# Patient Record
Sex: Female | Born: 1965
Health system: Southern US, Community
[De-identification: ages and names within clinical notes are randomized; demographics above are authoritative.]

## PROBLEM LIST (undated history)

## (undated) DIAGNOSIS — D649 Anemia, unspecified: Secondary | ICD-10-CM

## (undated) HISTORY — PX: TUBAL LIGATION: SHX77

---

## 2013-03-26 ENCOUNTER — Encounter (HOSPITAL_COMMUNITY): Payer: Self-pay | Admitting: *Deleted

## 2013-03-26 ENCOUNTER — Emergency Department (HOSPITAL_COMMUNITY)
Admission: EM | Admit: 2013-03-26 | Discharge: 2013-03-26 | Disposition: A | Discharge status: Home / Self Care | Payer: Self-pay | Attending: Emergency Medicine | Admitting: Emergency Medicine

## 2013-03-26 DIAGNOSIS — N390 Urinary tract infection, site not specified: Secondary | ICD-10-CM | POA: Insufficient documentation

## 2013-03-26 DIAGNOSIS — Z9851 Tubal ligation status: Secondary | ICD-10-CM | POA: Insufficient documentation

## 2013-03-26 DIAGNOSIS — F17200 Nicotine dependence, unspecified, uncomplicated: Secondary | ICD-10-CM | POA: Insufficient documentation

## 2013-03-26 DIAGNOSIS — R252 Cramp and spasm: Secondary | ICD-10-CM

## 2013-03-26 DIAGNOSIS — Z862 Personal history of diseases of the blood and blood-forming organs and certain disorders involving the immune mechanism: Secondary | ICD-10-CM | POA: Insufficient documentation

## 2013-03-26 HISTORY — DX: Anemia, unspecified: D64.9

## 2013-03-26 LAB — URINALYSIS, ROUTINE W REFLEX MICROSCOPIC
Glucose, UA: NEGATIVE mg/dL
Ketones, ur: NEGATIVE mg/dL
Nitrite: POSITIVE — AB
Protein, ur: 100 mg/dL — AB
Specific Gravity, Urine: 1.015 (ref 1.005–1.030)
Urobilinogen, UA: 1 mg/dL (ref 0.0–1.0)
pH: 5.5 (ref 5.0–8.0)

## 2013-03-26 LAB — CBC WITH DIFFERENTIAL/PLATELET
Basophils Absolute: 0.1 10*3/uL (ref 0.0–0.1)
Basophils Relative: 1 % (ref 0–1)
Eosinophils Absolute: 0.1 10*3/uL (ref 0.0–0.7)
Eosinophils Relative: 2 % (ref 0–5)
HCT: 28.1 % — ABNORMAL LOW (ref 36.0–46.0)
Hemoglobin: 8.8 g/dL — ABNORMAL LOW (ref 12.0–15.0)
Lymphocytes Relative: 33 % (ref 12–46)
Lymphs Abs: 2.4 10*3/uL (ref 0.7–4.0)
MCH: 20.8 pg — ABNORMAL LOW (ref 26.0–34.0)
MCHC: 31.3 g/dL (ref 30.0–36.0)
MCV: 66.4 fL — ABNORMAL LOW (ref 78.0–100.0)
Monocytes Absolute: 0.6 10*3/uL (ref 0.1–1.0)
Monocytes Relative: 8 % (ref 3–12)
Neutro Abs: 4.1 10*3/uL (ref 1.7–7.7)
Neutrophils Relative %: 56 % (ref 43–77)
Platelets: 190 10*3/uL (ref 150–400)
RBC: 4.23 MIL/uL (ref 3.87–5.11)
RDW: 17.3 % — ABNORMAL HIGH (ref 11.5–15.5)
WBC: 7.3 10*3/uL (ref 4.0–10.5)

## 2013-03-26 LAB — URINE MICROSCOPIC-ADD ON

## 2013-03-26 LAB — BASIC METABOLIC PANEL
BUN: 13 mg/dL (ref 6–23)
CO2: 21 mEq/L (ref 19–32)
Calcium: 9.6 mg/dL (ref 8.4–10.5)
Chloride: 99 mEq/L (ref 96–112)
Creatinine, Ser: 0.9 mg/dL (ref 0.50–1.10)
GFR calc Af Amer: 88 mL/min — ABNORMAL LOW (ref >90)
GFR calc non Af Amer: 76 mL/min — ABNORMAL LOW (ref >90)
Glucose, Bld: 94 mg/dL (ref 70–99)
Potassium: 3.6 mEq/L (ref 3.5–5.1)
Sodium: 132 mEq/L — ABNORMAL LOW (ref 135–145)

## 2013-03-26 MED ORDER — KETOROLAC TROMETHAMINE 15 MG/ML IJ SOLN
15.0000 mg | Freq: Once | INTRAMUSCULAR | Status: AC
  Administered 2013-03-26: 15 mg via INTRAVENOUS
  Filled 2013-03-26: qty 1

## 2013-03-26 MED ORDER — SODIUM CHLORIDE 0.9 % IV BOLUS (SEPSIS)
1000.0000 mL | Freq: Once | INTRAVENOUS | Status: AC
  Administered 2013-03-26: 1000 mL via INTRAVENOUS

## 2013-03-26 MED ORDER — SULFAMETHOXAZOLE-TRIMETHOPRIM 800-160 MG PO TABS: 1.0000 | ORAL_TABLET | Freq: Two times a day (BID) | ORAL | Status: AC

## 2013-03-26 MED ORDER — DEXTROSE 5 % IV SOLN
1.0000 g | Freq: Once | INTRAVENOUS | Status: AC
  Administered 2013-03-26: 1 g via INTRAVENOUS
  Filled 2013-03-26: qty 10

## 2013-03-26 NOTE — ED Notes (Signed)
Pt c/o cramps to entire body. Onset 1 hr ago while at work. Denies vomiting or diarrhea. Denies hx of same.

## 2013-03-26 NOTE — ED Provider Notes (Signed)
History    46yf with body cramps. Onset today while at work. Diffuse. NO rash. No joint swelling. No fever or chills. No cp or sob. In usual state of health when got up today. No recent meds or otcs. No signficant pmhx but does not have routine medical care.   CSN: [id number]  Arrival date & time 03/26/13  1401   First MD Initiated Contact with Patient 03/26/13 1625      Chief Complaint  Patient presents with  . Abdominal Cramping    (Consider location/radiation/quality/duration/timing/severity/associated sxs/prior treatment) HPI  Past Medical History  Diagnosis Date  . Anemia     Past Surgical History  Procedure Laterality Date  . Tubal ligation      History reviewed. No pertinent family history.  History  Substance Use Topics  . Smoking status: Current Every Day Smoker    Types: Cigarettes  . Smokeless tobacco: Not on file  . Alcohol Use: Yes    OB History   Grav Para Term Preterm Abortions TAB SAB Ect Mult Living                  Review of Systems  All systems reviewed and negative, other than as noted in HPI.   Allergies  Review of patient's allergies indicates no known allergies.  Home Medications   Current Outpatient Rx  Name  Route  Sig  Dispense  Refill  . Aspirin-Acetaminophen-Caffeine (GOODY HEADACHE PO)   Oral   Take 1 Package by mouth as needed (pain).           LMP 03/21/2013  Physical Exam  Nursing note and vitals reviewed. Constitutional: She appears well-developed and well-nourished. No distress.  HENT:  Head: Normocephalic and atraumatic.  Eyes: Conjunctivae are normal. Right eye exhibits no discharge. Left eye exhibits no discharge.  Neck: Neck supple.  Cardiovascular: Normal rate, regular rhythm and normal heart sounds.  Exam reveals no gallop and no friction rub.   No murmur heard. Pulmonary/Chest: Effort normal and breath sounds normal. No respiratory distress.  Abdominal: Soft. She exhibits no distension. There is no  tenderness.  Musculoskeletal: She exhibits no edema and no tenderness.  Neurological: She is alert.  Skin: Skin is warm and dry. No rash noted.  Psychiatric: She has a normal mood and affect. Her behavior is normal. Thought content normal.    ED Course  Procedures (including critical care time)  Labs Reviewed  CBC WITH DIFFERENTIAL - Abnormal; Notable for the following:    Hemoglobin 8.8 (*)    HCT 28.1 (*)    MCV 66.4 (*)    MCH 20.8 (*)    RDW 17.3 (*)    All other components within normal limits  BASIC METABOLIC PANEL - Abnormal; Notable for the following:    Sodium 132 (*)    GFR calc non Af Amer 76 (*)    GFR calc Af Amer 88 (*)    All other components within normal limits  URINALYSIS, ROUTINE W REFLEX MICROSCOPIC - Abnormal; Notable for the following:    Color, Urine RED (*)    APPearance TURBID (*)    Hgb urine dipstick LARGE (*)    Bilirubin Urine LARGE (*)    Protein, ur 100 (*)    Nitrite POSITIVE (*)    Leukocytes, UA SMALL (*)    All other components within normal limits  URINE MICROSCOPIC-ADD ON - Abnormal; Notable for the following:    Bacteria, UA MANY (*)    All other  components within normal limits   No results found.   1. UTI (urinary tract infection)   2. Muscle cramps       MDM  46yf with body cramps. Abdominal cramps possibly explained by UTI. HD stable. No rash. No muscle tenderness. Lytes ok. Plan tx for uti. Return precautions dicussed.         Raeford Razor, MD 03/29/13 [phone]

## 2013-03-26 NOTE — ED Notes (Signed)
Patient states that she has been having cramping that started in her her bilateral hands and arms and has progressed to her abdomen and back today. Patient denies any vomiting or diarrhea.

## 2014-10-30 ENCOUNTER — Ambulatory Visit (INDEPENDENT_AMBULATORY_CARE_PROVIDER_SITE_OTHER): Payer: 59 | Admitting: Family Medicine

## 2014-10-30 ENCOUNTER — Ambulatory Visit (INDEPENDENT_AMBULATORY_CARE_PROVIDER_SITE_OTHER): Payer: 59

## 2014-10-30 VITALS — BP 102/62 | HR 63 | Temp 98.1°F | Resp 16 | Ht 66.0 in | Wt 115.0 lb

## 2014-10-30 DIAGNOSIS — M5442 Lumbago with sciatica, left side: Secondary | ICD-10-CM

## 2014-10-30 MED ORDER — PREDNISONE 20 MG PO TABS: ORAL_TABLET | ORAL | Status: DC

## 2014-10-30 MED ORDER — HYDROCODONE-ACETAMINOPHEN 5-325 MG PO TABS: ORAL_TABLET | ORAL | Status: AC

## 2014-10-30 NOTE — Addendum Note (Signed)
Addended by: HOPPER, DAVID H on: 10/30/2014 02:16 PM   Modules accepted: Orders

## 2014-10-30 NOTE — Patient Instructions (Addendum)
Take the prednisone 3 tablets daily for 2 days, then 2 daily for 2 days, then 1 daily for 2 days, then one half daily for 4 days.  If not significantly improved over the next 10 days please return  Take ibuprofen or Aleve if needed for pain (maximum ibuprofen 800 (4200) mg 3 times daily or Aleve 440 (2220) mg 3 times daily)   Hydrocodone/APAP 5/325 only if needed for severe pain   Return if further problems at any time.
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
## 2014-11-03 ENCOUNTER — Telehealth: Payer: Self-pay

## 2014-11-03 NOTE — Telephone Encounter (Signed)
PATIENT STATES SHE CAME IN AND SAW DR. HOPPER ON Friday FOR LOW BACK PAIN. HE PRESCRIBED HER NORCO 5/325. SHE WOULD LIKE TO GET IT REFILLED. SHE WOULD LIKE TO BE CALLED WHEN THE PRESCRIPTION HAS BEEN WRITTEN SO THAT SHE CAN PICK IT UP. BEST PHONE [phone] (CELL)  MBC

## 2014-11-06 NOTE — Telephone Encounter (Signed)
If still having enough pain to need hydrocodone needs to be rechecked to decide if further studies or referral needed.  No refill on pain meds until seen.

## 2014-11-06 NOTE — Telephone Encounter (Signed)
Spoke to pt, she is aware of need for ov. She is still having back pain and will RTC as soon as possible.

## 2014-11-10 ENCOUNTER — Ambulatory Visit (INDEPENDENT_AMBULATORY_CARE_PROVIDER_SITE_OTHER): Payer: 59 | Admitting: Family Medicine

## 2014-11-10 VITALS — BP 108/68 | HR 71 | Temp 98.3°F | Resp 18 | Ht 68.0 in | Wt 113.0 lb

## 2014-11-10 DIAGNOSIS — M5416 Radiculopathy, lumbar region: Secondary | ICD-10-CM

## 2014-11-10 DIAGNOSIS — M5442 Lumbago with sciatica, left side: Secondary | ICD-10-CM

## 2014-11-10 MED ORDER — TRAMADOL HCL 50 MG PO TABS: 50.0000 mg | ORAL_TABLET | Freq: Three times a day (TID) | ORAL | Status: AC | PRN

## 2014-11-10 MED ORDER — PREDNISONE 20 MG PO TABS: ORAL_TABLET | ORAL | Status: AC

## 2014-11-10 NOTE — Progress Notes (Signed)
Subjective: 48 year old lady who was here 11 days ago with similar symptoms. She continues to have a lot of trouble with a burning pain going down her legs. Sometimes as far as the calves. She hurts down into the medial thigh area. She says it feels like an electric shock shooting down. It is okay when she is laying in bed, but when she gets up in the morning it is very bad. She has not been able to take the hydrocodone when she is going to work. She works doing housekeeping at [employer]. Knows of no specific injury.  Objective: No real tenderness over the low back. Sensation is normal and the thighs. She denies any bladder or bowel problems. Straight leg raising test 80 on the right but only about 65 to 70 on the left. With that she described pain going down to her calf. Deep tender reflexes are 1-2+ symmetrical. Sensory grossly intact.  Assessment: Lumbar radiculopathy  Plan: Conservative treatment with steroids and pain medications have not helped. We will leave her off work for one week. We will try tramadol for milder pain. And going to give one more round of steroids.

## 2014-11-10 NOTE — Patient Instructions (Signed)
We will leave you off work for one week and have you return for a recheck at that time.  MRI is being scheduled. This usually has to be approved through the insurance carrier and sometimes takes a little while to get accomplished. Y  Try tramadol for milder pain relief  Return if ever acutely worse or bladder or bowel symptoms

## 2014-11-16 ENCOUNTER — Telehealth: Payer: Self-pay

## 2014-11-16 NOTE — Telephone Encounter (Signed)
Pt is needing to talk with someone about an mri

## 2014-11-17 NOTE — Telephone Encounter (Signed)
Spoke to pt about MRI- gave number to [facility] to call to schedule.

## 2014-11-19 ENCOUNTER — Other Ambulatory Visit: Payer: Self-pay | Admitting: Family Medicine

## 2014-11-19 ENCOUNTER — Telehealth: Payer: Self-pay

## 2014-11-19 NOTE — Telephone Encounter (Signed)
Please advise if ok to extend OOW note

## 2014-11-19 NOTE — Telephone Encounter (Signed)
Call patient: I am not going to refill any more prednisone. I'm not sure which medicine refill we are talking about. If the pain is too bad she needs to be rechecked between now and when the MRI is done. I do not know what date that is gotten scheduled for yet.

## 2014-11-19 NOTE — Telephone Encounter (Signed)
Pt is needing to talk with someone about extending her work note

## 2014-11-19 NOTE — Telephone Encounter (Signed)
Pt called to check on status of her medication refill. Please advise at [phone] (H)

## 2014-11-20 NOTE — Telephone Encounter (Signed)
Spoke to patient, advised unable to refill the prednisone as she has already had a couple rounds, and side effects for long term prednisone can be very serious. Work note provided through the end of the week, hopefully the MRI scan will be done before then. Note to referrals Please check on the status of the MRI patient c/o severe Low back pain

## 2014-11-20 NOTE — Telephone Encounter (Signed)
Left message for her to call back so I can advise.  

## 2014-11-24 NOTE — Telephone Encounter (Signed)
According to notes from [facility] patient is waiting to schedule mri referral

## 2014-11-25 ENCOUNTER — Encounter: Payer: Self-pay | Admitting: Family Medicine

## 2014-11-25 ENCOUNTER — Telehealth: Payer: Self-pay | Admitting: Family Medicine

## 2014-11-25 NOTE — Telephone Encounter (Signed)
[employer] faxed FMLA forms for this patient on 11/23/2014. Placed in Hopper's box on 11/25/2013. [facility] complete.

## 2014-11-26 ENCOUNTER — Ambulatory Visit
Admission: RE | Admit: 2014-11-26 | Discharge: 2014-11-26 | Disposition: A | Discharge status: Home / Self Care | Payer: 59 | Source: Ambulatory Visit | Attending: Family Medicine | Admitting: Family Medicine

## 2014-11-26 DIAGNOSIS — M5416 Radiculopathy, lumbar region: Secondary | ICD-10-CM

## 2014-12-28 ENCOUNTER — Telehealth: Payer: Self-pay

## 2014-12-28 NOTE — Telephone Encounter (Signed)
Pt dropped off FMLA for Dr. Alwyn RenHopper to complete in 5-7 business days

## 2014-12-31 NOTE — Telephone Encounter (Signed)
Received FMLA ppw back from Dr. Alwyn RenHopper requesting that pt return to clinic for a follow up before completing ppw. Spoke with Pt, and she understood. Will make sure there is a copy in Pt's file for when she decides to RTC

## 2015-01-11 ENCOUNTER — Ambulatory Visit (INDEPENDENT_AMBULATORY_CARE_PROVIDER_SITE_OTHER): Payer: 59 | Admitting: Family Medicine

## 2015-01-11 ENCOUNTER — Telehealth: Payer: Self-pay | Admitting: Family Medicine

## 2015-01-11 VITALS — BP 116/62 | HR 98 | Temp 97.8°F | Resp 18 | Ht 68.5 in | Wt 115.0 lb

## 2015-01-11 DIAGNOSIS — M545 Low back pain, unspecified: Secondary | ICD-10-CM

## 2015-01-11 MED ORDER — DICLOFENAC SODIUM 75 MG PO TBEC: 75.0000 mg | DELAYED_RELEASE_TABLET | Freq: Two times a day (BID) | ORAL | Status: AC

## 2015-01-11 NOTE — Telephone Encounter (Signed)
Patient had OV on 01/11/2015. Patient inquired about her FMLA paperwork during her visit. FMLA paperwork not located, patient notified of that. There is a scanned copy of paperwork in patient's chart however page 2 of the 3 pages is missing. Called [employer] to have another copy faxed to our office. Was on hold for 15 mins then was disconnected. Will call back tomorrow.

## 2015-01-11 NOTE — Patient Instructions (Signed)
Take diclofenac 75 mg 1 pill twice daily with food at breakfast and supper. Discontinue use if it causes abdominal pain. Her graph return in one month for recheck.   Before you come in call the office and find out what my shift is so that you can come in while I'm here. Ask for me when you sign in..Marland Kitchen

## 2015-01-11 NOTE — Progress Notes (Signed)
Subjective: 49 year old lady who returns because I stated that I could not rule out the FMLA form until I had her in front of me. However, the form appears to been displaced right now, and they're looking for it. She missed the time out of work back in December when I saw her. Subsequent to that she has no missed work, but is continued to have a lot of pain in her low back. She found out the given her a a lot of points for the absence from work in that period of time when I was seeing her. She hurts all the time. Sometimes she feels like she just has to have a hard time even lifting up her fetus she walks. She's not had any falls or other injuries. She does not do stairs, so is not really cognizant of how she would do on them. No abnormal sensations in her legs. Reviewed the results of the MRI which was normal. She does continue to have a lot of pain in the low back. It is directly midline.   Objective: No major distress. Very tender in the lower lumbar spine at about the L5 region. No radiculopathy. Straight leg raising negative. Gait seems a little clumsy right when she gets up, she walks with a little difficulty but then loosened on up and walks better. She has mild intoeing. Deep tendon reflexes 2+ at knees, 1+ ankle. Negative Babinski. Sensory grossly intact. Pedal pulses good.  Assessment: Low back pain in lumbar region, etiology undetermined  Plan: If she has more of the problems with her legs feeling rubbery she is to let me know. We might have to refer her to a neurologist. However she had a normal MRI and just seems to be tender in the low back. Will complete the FMLA forms when they can be located. I may have to call her and asked questions. Put on diclofenac for one month and have her return to time.

## 2015-01-12 DIAGNOSIS — Z0271 Encounter for disability determination: Secondary | ICD-10-CM

## 2015-11-18 ENCOUNTER — Encounter (HOSPITAL_COMMUNITY): Payer: Self-pay | Admitting: Emergency Medicine

## 2015-11-18 ENCOUNTER — Emergency Department (HOSPITAL_COMMUNITY): Payer: 59

## 2015-11-18 ENCOUNTER — Emergency Department (HOSPITAL_COMMUNITY)
Admission: EM | Admit: 2015-11-18 | Discharge: 2015-11-18 | Disposition: A | Discharge status: Home / Self Care | Payer: 59 | Attending: Emergency Medicine | Admitting: Emergency Medicine

## 2015-11-18 DIAGNOSIS — R200 Anesthesia of skin: Secondary | ICD-10-CM | POA: Diagnosis not present

## 2015-11-18 DIAGNOSIS — Z862 Personal history of diseases of the blood and blood-forming organs and certain disorders involving the immune mechanism: Secondary | ICD-10-CM | POA: Insufficient documentation

## 2015-11-18 DIAGNOSIS — Y9389 Activity, other specified: Secondary | ICD-10-CM | POA: Insufficient documentation

## 2015-11-18 DIAGNOSIS — W2209XA Striking against other stationary object, initial encounter: Secondary | ICD-10-CM | POA: Diagnosis not present

## 2015-11-18 DIAGNOSIS — Y9289 Other specified places as the place of occurrence of the external cause: Secondary | ICD-10-CM | POA: Diagnosis not present

## 2015-11-18 DIAGNOSIS — S5001XA Contusion of right elbow, initial encounter: Secondary | ICD-10-CM | POA: Diagnosis not present

## 2015-11-18 DIAGNOSIS — F17210 Nicotine dependence, cigarettes, uncomplicated: Secondary | ICD-10-CM | POA: Diagnosis not present

## 2015-11-18 DIAGNOSIS — S59901A Unspecified injury of right elbow, initial encounter: Secondary | ICD-10-CM | POA: Diagnosis present

## 2015-11-18 DIAGNOSIS — Y999 Unspecified external cause status: Secondary | ICD-10-CM | POA: Insufficient documentation

## 2015-11-18 DIAGNOSIS — Z791 Long term (current) use of non-steroidal anti-inflammatories (NSAID): Secondary | ICD-10-CM | POA: Insufficient documentation

## 2015-11-18 MED ORDER — TRAMADOL HCL 50 MG PO TABS
50.0000 mg | ORAL_TABLET | Freq: Four times a day (QID) | ORAL | Status: AC | PRN
Start: 2015-11-18 — End: ?

## 2015-11-18 MED ORDER — ACETAMINOPHEN 325 MG PO TABS
650.0000 mg | ORAL_TABLET | Freq: Once | ORAL | Status: AC
  Administered 2015-11-18: 650 mg via ORAL
  Filled 2015-11-18: qty 2

## 2015-11-18 NOTE — ED Notes (Signed)
Called for patient 3 times, No answer , The Nurse was informed.

## 2015-11-18 NOTE — Discharge Instructions (Signed)
Elbow Contusion °An elbow contusion is a deep bruise of the elbow. Contusions are the result of an injury that caused bleeding under the skin. The contusion may turn blue, purple, or yellow. Minor injuries will give you a painless contusion, but more severe contusions may stay painful and swollen for a few weeks.  °CAUSES  °An elbow contusion comes from a direct force to that area, such as falling on the elbow. °SYMPTOMS  °· Swelling and redness of the elbow. °· Bruising of the elbow area. °· Tenderness or soreness of the elbow. °DIAGNOSIS  °You will have a physical exam and will be asked about your history. You may need an X-ray of your elbow to look for a broken bone (fracture).  °TREATMENT  °A sling or splint may be needed to support your injury. Resting, elevating, and applying cold compresses to the elbow area are often the best treatments for an elbow contusion. Over-the-counter medicines may also be recommended for pain control. °HOME CARE INSTRUCTIONS  °· Put ice on the injured area. °¨ Put ice in a plastic bag. °¨ Place a towel between your skin and the bag. °¨ Leave the ice on for 15-20 minutes, 03-04 times a day. °· Only take over-the-counter or prescription medicines for pain, discomfort, or fever as directed by your caregiver. °· Rest your injured elbow until the pain and swelling are better. °· Elevate your elbow to reduce swelling. °· Apply a compression wrap as directed by your caregiver. This can help reduce swelling and motion. You may remove the wrap for sleeping, showers, and baths. If your fingers become numb, cold, or blue, take the wrap off and reapply it more loosely. °· Use your elbow only as directed by your caregiver. You may be asked to do range of motion exercises. Do them as directed. °· See your caregiver as directed. It is very important to keep all follow-up appointments in order to avoid any long-term problems with your elbow, including chronic pain or inability to move your elbow  normally. °SEEK IMMEDIATE MEDICAL CARE IF:  °· You have increased redness, swelling, or pain in your elbow. °· Your swelling or pain is not relieved with medicines. °· You have swelling of the hand and fingers. °· You are unable to move your fingers or wrist. °· You begin to lose feeling in your hand or fingers. °· Your fingers or hand become cold or blue. °MAKE SURE YOU:  °· Understand these instructions. °· Will watch your condition. °· Will get help right away if you are not doing well or get worse. °  °This information is not intended to replace advice given to you by your health care provider. Make sure you discuss any questions you have with your health care provider. °  °Document Released: 10/15/2006 Document Revised: 01/29/2012 Document Reviewed: 06/21/2015 °Elsevier Interactive Patient Education ©2016 Elsevier Inc. ° °

## 2015-11-18 NOTE — ED Notes (Signed)
Patient states hit R elbow on door/wall x 3 weeks ago.  Patient states increased pain since.  Denies other problems.

## 2015-11-18 NOTE — ED Notes (Signed)
Waiting for ortho to place elbow brace.

## 2015-11-18 NOTE — ED Provider Notes (Signed)
CSN: [id number]     Arrival date & time 11/18/15  1033 History  By signing my name below, I, Shelby Adams, attest that this documentation has been prepared under the direction and in the presence of Marlon Peliffany Azion Centrella, PA-C Electronically Signed: Charline BillsEssence Adams, ED Scribe 11/18/2015 at 12:21 PM.   Chief Complaint  Patient presents with  . Elbow Injury   The history is provided by the patient. No language interpreter was used.   HPI Comments: Shelby Adams is a 49 y.o. female who presents to the Emergency Department complaining of gradually worsening, constant right elbow pain onset 3 weeks ago. Pt states that she struck her right elbow on a car door twice and once on a wall at work while pulling on an object. Pt reports a 8/10, burning sensation in her right elbow that is exacerbated with palpation and movement. She also reports associated numbness in her right fingers. She has tried an ace wrap last night without significant relief. No medications tried PTA.  Past Medical History  Diagnosis Date  . Anemia    Past Surgical History  Procedure Laterality Date  . Tubal ligation     Family History  Problem Relation Age of Onset  . COPD Mother    Social History  Substance Use Topics  . Smoking status: Current Every Day Smoker -- 0.50 packs/day for 0 years    Types: Cigarettes  . Smokeless tobacco: None  . Alcohol Use: Yes   OB History    No data available     Review of Systems  Musculoskeletal: Positive for arthralgias.  Neurological: Positive for numbness.  All other systems reviewed and are negative.  Allergies  Review of patient's allergies indicates no known allergies.  Home Medications   Prior to Admission medications   Medication Sig Start Date End Date Taking? Authorizing Provider  Aspirin-Acetaminophen-Caffeine (GOODY HEADACHE PO) Take 1 Package by mouth as needed (pain).    Historical Provider, MD  diclofenac (VOLTAREN) 75 MG EC tablet Take 1 tablet (75 mg total)  by mouth 2 (two) times daily. 01/11/15   Peyton Najjaravid H Hopper, MD  HYDROcodone-acetaminophen [facility](NORCO) 5-325 MG per tablet Take one every 6 hours only if needed for severe pain Patient not taking: Reported on 01/11/2015 10/30/14   Peyton Najjaravid H Hopper, MD  predniSONE (DELTASONE) 20 MG tablet Take 3 daily for 2 days, then 2 daily for 2 days, then 1 daily for 2 days, then one half daily. Take after breakfast. Patient not taking: Reported on 01/11/2015 11/10/14   Peyton Najjaravid H Hopper, MD  sulfamethoxazole-trimethoprim (SEPTRA DS) 800-160 MG per tablet Take 1 tablet by mouth every 12 (twelve) hours. Patient not taking: Reported on 10/30/2014 03/26/13   Raeford RazorStephen Kohut, MD  traMADol (ULTRAM) 50 MG tablet Take 1 tablet (50 mg total) by mouth every 8 (eight) hours as needed. Patient not taking: Reported on 01/11/2015 11/10/14   Peyton Najjaravid H Hopper, MD  traMADol (ULTRAM) 50 MG tablet Take 1 tablet (50 mg total) by mouth every 6 (six) hours as needed. 11/18/15   Zebedee Segundo Neva SeatGreene, PA-C   BP 120/74 mmHg  Pulse 68  Temp(Src) 97.6 F (36.4 C) (Oral)  Resp 17  Ht 5\' 6"  (1.676 m)  Wt 110 lb (49.896 kg)  BMI 17.76 kg/m2  SpO2 100%  LMP 03/21/2013 Physical Exam  Constitutional: She is oriented to person, place, and time. She appears well-developed and well-nourished. No distress.  HENT:  Head: Normocephalic and atraumatic.  Eyes: Conjunctivae and EOM are normal.  Neck: Neck supple. No tracheal deviation present.  Cardiovascular: Normal rate.   Pulmonary/Chest: Effort normal. No respiratory distress.  Musculoskeletal:       Right elbow: She exhibits decreased range of motion (due to pain). She exhibits no swelling, no effusion, no deformity and no laceration. Tenderness found. Radial head and olecranon process tenderness noted. No medial epicondyle and no lateral epicondyle tenderness noted.  Pain to elbow with ROM of wrist.  Neurological: She is alert and oriented to person, place, and time.  Skin: Skin is warm and dry.  Psychiatric:  She has a normal mood and affect. Her behavior is normal.  Nursing note and vitals reviewed.  ED Course  Procedures (including critical care time) DIAGNOSTIC STUDIES: Oxygen Saturation is 100% on RA, normal by my interpretation.    COORDINATION OF CARE: 11:15 AM-Discussed treatment plan which includes Tylenol and XR with pt at bedside and pt agreed to plan.   Labs Review Labs Reviewed - No data to display  Imaging Review Dg Elbow Complete Right  11/18/2015  CLINICAL DATA:  Right elbow pain after hitting door 3 weeks ago. EXAM: RIGHT ELBOW - COMPLETE 3+ VIEW COMPARISON:  None. FINDINGS: There is no evidence of fracture, dislocation, or joint effusion. There is no evidence of arthropathy or other focal bone abnormality. Soft tissues are unremarkable. IMPRESSION: Normal right elbow. Electronically Signed   By: Lupita Raider, M.D.   On: 11/18/2015 12:14   I have personally reviewed and evaluated these images and lab results as part of my medical decision-making.   EKG Interpretation None      MDM   Final diagnoses:  Elbow contusion, right, initial encounter   Patient X-Ray negative for obvious fracture or dislocation. Pain managed in ED. Pt advised to follow up with orthopedics if symptoms persist for possibility of missed fracture diagnosis. Patient given an elbow brace while in ED, conservative therapy recommended and discussed. NSAIDS, rest and pain medication. She will be referred to the orthopedic provider since there is no hand specialist on call for today. Patient will be dc home & is agreeable with above plan.  I personally performed the services described in this documentation, which was scribed in my presence. The recorded information has been reviewed and is accurate.    Marlon Pel, PA-C 11/18/15 1228  Donnetta Hutching, MD 11/18/15 [phone]

## 2015-11-18 NOTE — ED Notes (Signed)
Ace wrap placed.   Patient at door waiting to go.

## 2015-11-26 DIAGNOSIS — S5401XA Injury of ulnar nerve at forearm level, right arm, initial encounter: Secondary | ICD-10-CM | POA: Diagnosis not present

## 2015-11-26 DIAGNOSIS — S5001XA Contusion of right elbow, initial encounter: Secondary | ICD-10-CM | POA: Diagnosis not present

## 2015-12-17 DIAGNOSIS — S5001XD Contusion of right elbow, subsequent encounter: Secondary | ICD-10-CM | POA: Diagnosis not present

## 2015-12-17 DIAGNOSIS — S5401XD Injury of ulnar nerve at forearm level, right arm, subsequent encounter: Secondary | ICD-10-CM | POA: Diagnosis not present

## 2015-12-17 DIAGNOSIS — M542 Cervicalgia: Secondary | ICD-10-CM | POA: Diagnosis not present

## 2017-06-27 IMAGING — DX DG ELBOW COMPLETE 3+V*R*
3 series · 3 of 3 positions shown · non-contrast
Comparison: None.

CLINICAL DATA: Right elbow pain after hitting door 3 weeks ago.

EXAM:
RIGHT ELBOW - COMPLETE 3+ VIEW

[x elbow obl right (1 of 2)]
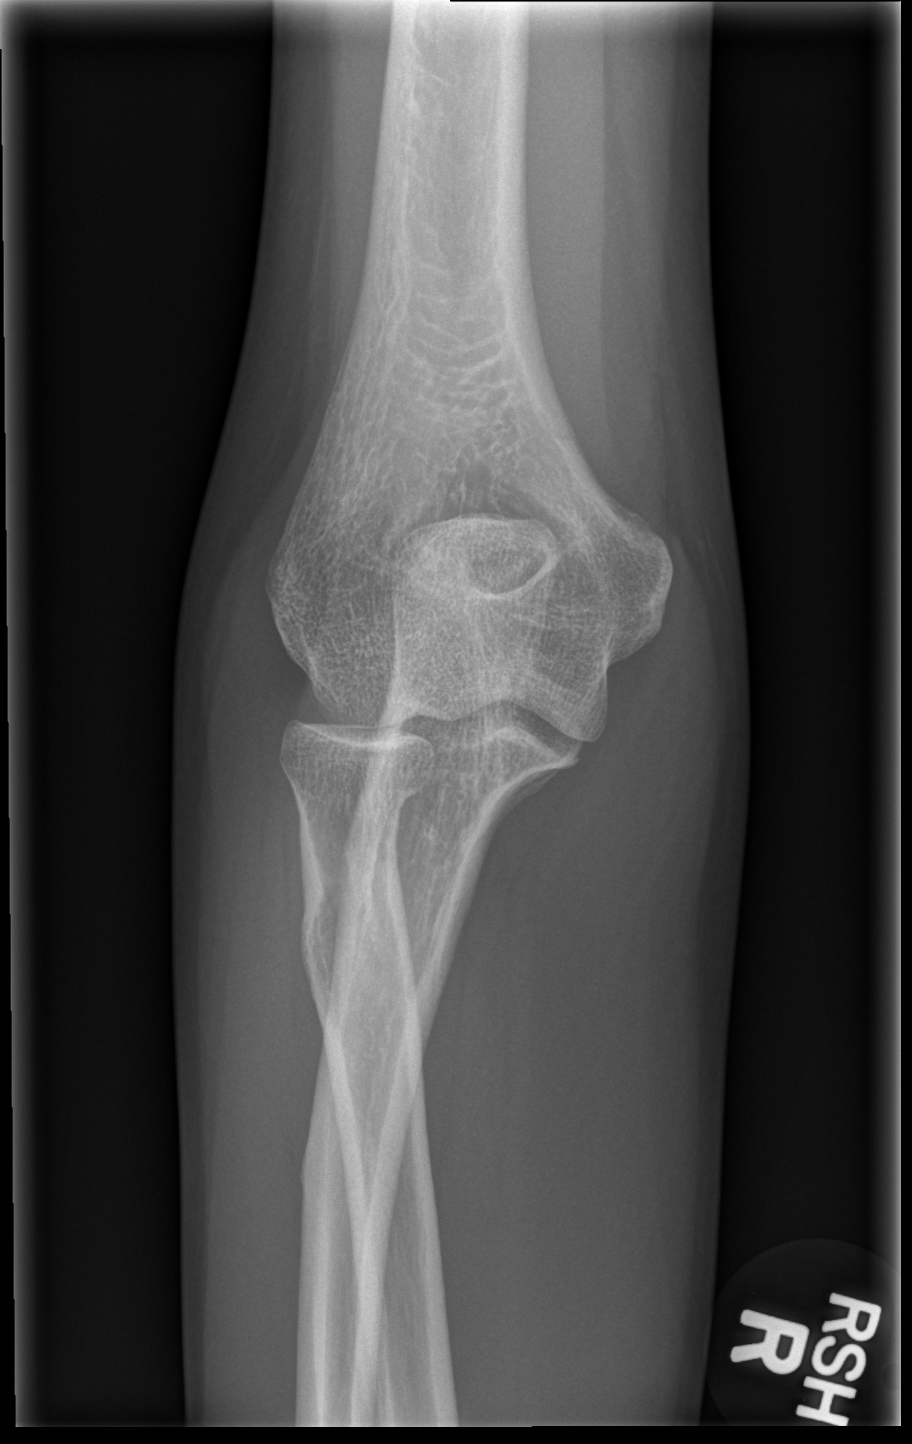

[x elbow obl right (2 of 2)]
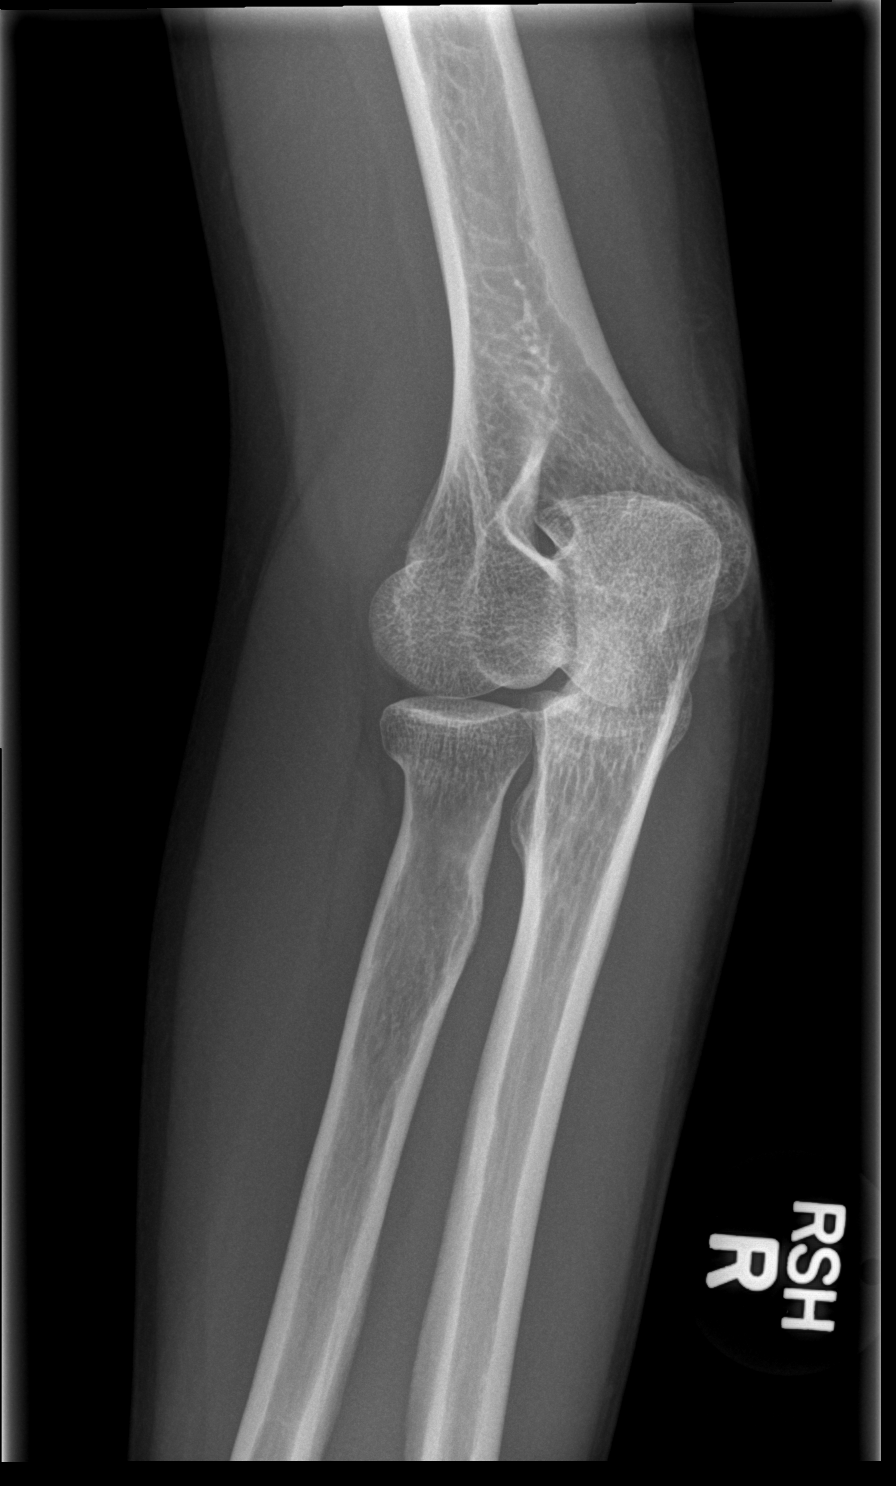

[x elbow lat right]
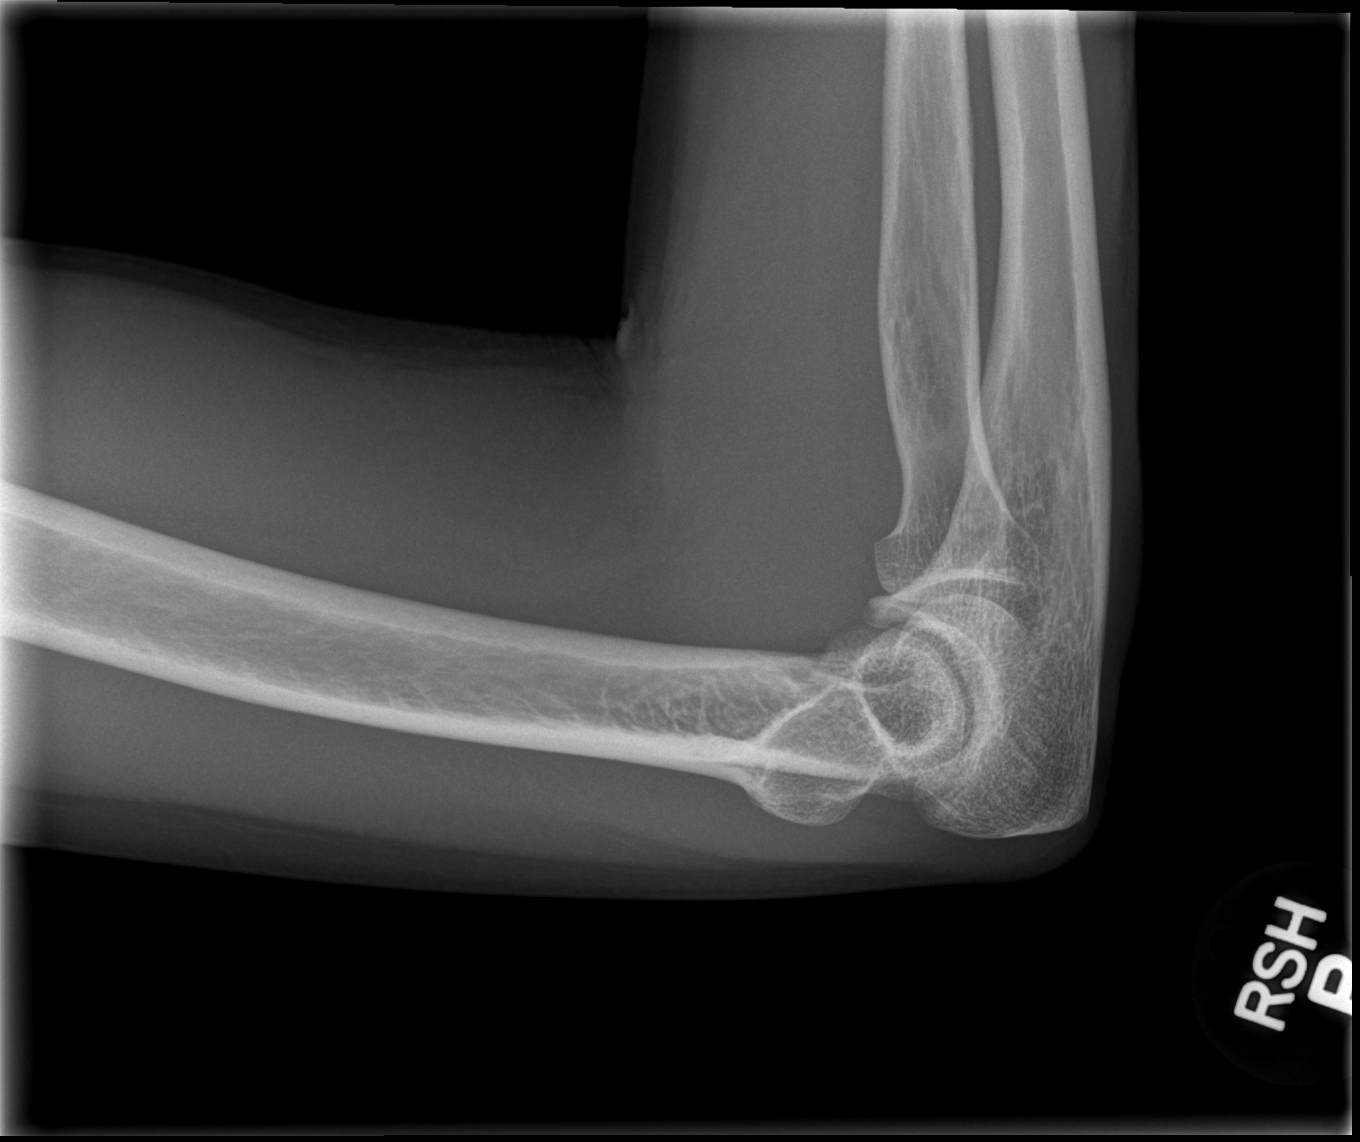

[3 of 3 positions shown; findings below may reference images not displayed]

FINDINGS: There is no evidence of fracture, dislocation, or joint effusion.
There is no evidence of arthropathy or other focal bone abnormality.
Soft tissues are unremarkable.
IMPRESSION: Normal right elbow.

## 2020-09-10 ENCOUNTER — Ambulatory Visit: Payer: 59 | Attending: Internal Medicine

## 2020-09-10 ENCOUNTER — Other Ambulatory Visit (HOSPITAL_BASED_OUTPATIENT_CLINIC_OR_DEPARTMENT_OTHER): Payer: Self-pay | Admitting: Internal Medicine

## 2020-09-10 DIAGNOSIS — Z23 Encounter for immunization: Secondary | ICD-10-CM

## 2020-09-10 NOTE — Progress Notes (Signed)
   Covid-19 Vaccination Clinic  Name:  Shelby Adams    MRN: [id number] DOB: 02/02/1966  09/10/2020  [name] was observed post Covid-19 immunization for 15 minutes without incident. She was provided with Vaccine Information Sheet and instruction to access the V-Safe system.  Vaccinated by [facility]  [name] was instructed to call 911 with any severe reactions post vaccine: Marland Kitchen Difficulty breathing  . Swelling of face and throat  . A fast heartbeat  . A bad rash all over body  . Dizziness and weakness   Immunizations Administered    Name Date Dose VIS Date Route   Moderna COVID-19 Vaccine 09/10/2020  9:16 AM 0.5 mL 10/2019 Intramuscular   Manufacturer: Moderna   Lot: [id number]   NDC: 62863-817-71

## 2020-09-15 MED FILL — MODERNA COVID-19 VACCINE 10: 100 | 1 days supply | Qty: 0 | Fill #0
